# Patient Record
Sex: Male | Born: 1985 | Race: White | Hispanic: Yes | Marital: Single | Smoking: Current every day smoker
Health system: Southern US, Community
[De-identification: ages and names within clinical notes are randomized; demographics above are authoritative.]

---

## 2010-11-05 ENCOUNTER — Encounter: Payer: Self-pay | Admitting: Emergency Medicine

## 2010-11-05 ENCOUNTER — Emergency Department (INDEPENDENT_AMBULATORY_CARE_PROVIDER_SITE_OTHER): Payer: Self-pay

## 2010-11-05 ENCOUNTER — Emergency Department (HOSPITAL_BASED_OUTPATIENT_CLINIC_OR_DEPARTMENT_OTHER)
Admission: EM | Admit: 2010-11-05 | Discharge: 2010-11-05 | Disposition: A | Discharge status: Home / Self Care | Payer: Self-pay | Attending: Emergency Medicine | Admitting: Emergency Medicine

## 2010-11-05 DIAGNOSIS — Y92410 Unspecified street and highway as the place of occurrence of the external cause: Secondary | ICD-10-CM | POA: Insufficient documentation

## 2010-11-05 DIAGNOSIS — R079 Chest pain, unspecified: Secondary | ICD-10-CM

## 2010-11-05 DIAGNOSIS — M542 Cervicalgia: Secondary | ICD-10-CM

## 2010-11-05 DIAGNOSIS — M25519 Pain in unspecified shoulder: Secondary | ICD-10-CM

## 2010-11-05 LAB — ETHANOL: Alcohol, Ethyl (B): 144 mg/dL — ABNORMAL HIGH (ref 0–11)

## 2010-11-05 MED ORDER — OXYCODONE-ACETAMINOPHEN 5-325 MG PO TABS: 2.0000 | ORAL_TABLET | ORAL | Status: AC | PRN

## 2010-11-05 MED ORDER — SODIUM CHLORIDE 0.9 % IV SOLN
Freq: Once | INTRAVENOUS | Status: AC
  Administered 2010-11-05: 06:00:00 via INTRAVENOUS
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
## 2010-11-05 NOTE — ED Notes (Signed)
Assisted pt with ambulation in hallway without difficulty.  Pt is CAOx4.  C-Collar removed by Dr. Radford Pax.  He continues to report left shoulder pain that increases with movement.

## 2010-11-05 NOTE — ED Notes (Signed)
No change in pt assessment.  Pt continues to sleep and awakens to voice.
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
## 2013-03-28 ENCOUNTER — Emergency Department (HOSPITAL_BASED_OUTPATIENT_CLINIC_OR_DEPARTMENT_OTHER)
Admission: EM | Admit: 2013-03-28 | Discharge: 2013-03-28 | Disposition: A | Discharge status: Home / Self Care | Payer: Self-pay | Attending: Emergency Medicine | Admitting: Emergency Medicine

## 2013-03-28 ENCOUNTER — Emergency Department (HOSPITAL_BASED_OUTPATIENT_CLINIC_OR_DEPARTMENT_OTHER): Payer: Self-pay

## 2013-03-28 ENCOUNTER — Encounter (HOSPITAL_BASED_OUTPATIENT_CLINIC_OR_DEPARTMENT_OTHER): Payer: Self-pay | Admitting: Emergency Medicine

## 2013-03-28 DIAGNOSIS — F17200 Nicotine dependence, unspecified, uncomplicated: Secondary | ICD-10-CM | POA: Insufficient documentation

## 2013-03-28 DIAGNOSIS — B9789 Other viral agents as the cause of diseases classified elsewhere: Secondary | ICD-10-CM | POA: Insufficient documentation

## 2013-03-28 DIAGNOSIS — R0602 Shortness of breath: Secondary | ICD-10-CM | POA: Insufficient documentation

## 2013-03-28 DIAGNOSIS — B349 Viral infection, unspecified: Secondary | ICD-10-CM

## 2013-03-28 MED ORDER — BENZONATATE 100 MG PO CAPS: 100.0000 mg | ORAL_CAPSULE | Freq: Three times a day (TID) | ORAL | Status: DC

## 2013-03-28 MED ORDER — IBUPROFEN 800 MG PO TABS
800.0000 mg | ORAL_TABLET | Freq: Once | ORAL | Status: AC
  Administered 2013-03-28: 800 mg via ORAL
  Filled 2013-03-28: qty 1

## 2013-03-28 MED ORDER — ONDANSETRON 4 MG PO TBDP
4.0000 mg | ORAL_TABLET | Freq: Once | ORAL | Status: AC
  Administered 2013-03-28: 4 mg via ORAL
  Filled 2013-03-28: qty 1

## 2013-03-28 MED ORDER — IBUPROFEN 800 MG PO TABS: 800.0000 mg | ORAL_TABLET | Freq: Three times a day (TID) | ORAL | Status: DC

## 2013-03-28 NOTE — ED Provider Notes (Signed)
Medical screening examination/treatment/procedure(s) were performed by non-physician practitioner and as supervising physician I was immediately available for consultation/collaboration.     Geoffery Lyonsouglas Liany Mumpower, MD 03/28/13 [id number]

## 2013-03-28 NOTE — ED Provider Notes (Signed)
CSN: [id number]     Arrival date & time 03/28/13  1052 History   First MD Initiated Contact with Patient 03/28/13 1229     Chief Complaint  Patient presents with  . Influenza   (Consider location/radiation/quality/duration/timing/severity/associated sxs/prior Treatment) Patient is a 28 y.o. male presenting with cough. The history is provided by the patient. No language interpreter was used.  Cough Cough characteristics:  Productive Sputum characteristics:  Green Severity:  Moderate Onset quality:  Gradual Duration:  1 week Timing:  Constant Progression:  Worsening Relieved by:  Nothing Worsened by:  Nothing tried Ineffective treatments:  None tried Associated symptoms: fever, headaches, shortness of breath and sore throat     History reviewed. No pertinent past medical history. History reviewed. No pertinent past surgical history. No family history on file. History  Substance Use Topics  . Smoking status: Current Every Day Smoker -- 0.50 packs/day    Types: Cigarettes  . Smokeless tobacco: Not on file  . Alcohol Use: Yes    Review of Systems  Constitutional: Positive for fever.  HENT: Positive for sore throat.   Respiratory: Positive for cough and shortness of breath.   Neurological: Positive for headaches.  All other systems reviewed and are negative.    Allergies  Fish oil  Home Medications   Current Outpatient Rx  Name  Route  Sig  Dispense  Refill  . fentaNYL (FENTORA) 100 MCG buccal tablet   Intravenous   Inject 150 mcg into the vein once.            BP 120/72  Pulse 101  Temp(Src) 98.3 F (36.8 C) (Oral)  Resp 16  Ht 5\' 5"  (1.651 m)  Wt 155 lb (70.308 kg)  BMI 25.79 kg/m2  SpO2 100% Physical Exam  Nursing note and vitals reviewed. Constitutional: He appears well-developed and well-nourished.  HENT:  Head: Normocephalic.  Right Ear: External ear normal.  Left Ear: External ear normal.  Nose: Nose normal.  Mouth/Throat: Oropharynx is clear  and moist.  Eyes: Conjunctivae and EOM are normal. Pupils are equal, round, and reactive to light.  Neck: Normal range of motion. Neck supple.  Cardiovascular: Normal rate and normal heart sounds.   Pulmonary/Chest: Effort normal and breath sounds normal.  Abdominal: Soft.  Musculoskeletal: Normal range of motion.  Neurological: He is alert.  Skin: Skin is warm.    ED Course  Procedures (including critical care time) Labs Review Labs Reviewed - No data to display Imaging Review Dg Chest 2 View  03/28/2013   CLINICAL DATA:  Cough and fever.  EXAM: CHEST  2 VIEW  COMPARISON:  11/05/2010  FINDINGS: The heart size and mediastinal contours are within normal limits. Both lungs are clear. The visualized skeletal structures are unremarkable.  IMPRESSION: No active cardiopulmonary disease.   Electronically Signed   By: Richarda OverlieAdam  Henn M.D.   On: 03/28/2013 14:11    EKG Interpretation   None       MDM   1. Viral illness   rx for ibuprofen and tessalon.    Return if any problems    Elson AreasLeslie K Dwayne Bulkley, [place]PA-C 03/28/13 1443

## 2013-03-28 NOTE — ED Notes (Signed)
Cough, fever, nausea, no appetite, and body aches for a week.

## 2013-03-28 NOTE — Discharge Instructions (Signed)
Viral Infections °A virus is a type of germ. Viruses can cause: °· Minor sore throats. °· Aches and pains. °· Headaches. °· Runny nose. °· Rashes. °· Watery eyes. °· Tiredness. °· Coughs. °· Loss of appetite. °· Feeling sick to your stomach (nausea). °· Throwing up (vomiting). °· Watery poop (diarrhea). °HOME CARE  °· Only take medicines as told by your doctor. °· Drink enough water and fluids to keep your pee (urine) clear or pale yellow. Sports drinks are a good choice. °· Get plenty of rest and eat healthy. Soups and broths with crackers or rice are fine. °GET HELP RIGHT AWAY IF:  °· You have a very bad headache. °· You have shortness of breath. °· You have chest pain or neck pain. °· You have an unusual rash. °· You cannot stop throwing up. °· You have watery poop that does not stop. °· You cannot keep fluids down. °· You or your child has a temperature by mouth above 102° F (38.9° C), not controlled by medicine. °· Your baby is older than 3 months with a rectal temperature of 102° F (38.9° C) or higher. °· Your baby is 3 months old or younger with a rectal temperature of 100.4° F (38° C) or higher. °MAKE SURE YOU:  °· Understand these instructions. °· Will watch this condition. °· Will get help right away if you are not doing well or get worse. °Document Released: 02/25/2008 Document Revised: 06/06/2011 Document Reviewed: 07/20/2010 °ExitCare® Patient Information ©2014 [employer]. ° °

## 2013-06-12 ENCOUNTER — Emergency Department (HOSPITAL_BASED_OUTPATIENT_CLINIC_OR_DEPARTMENT_OTHER)
Admission: EM | Admit: 2013-06-12 | Discharge: 2013-06-12 | Disposition: A | Discharge status: Home / Self Care | Payer: Self-pay | Attending: Emergency Medicine | Admitting: Emergency Medicine

## 2013-06-12 ENCOUNTER — Encounter (HOSPITAL_BASED_OUTPATIENT_CLINIC_OR_DEPARTMENT_OTHER): Payer: Self-pay | Admitting: Emergency Medicine

## 2013-06-12 DIAGNOSIS — K598 Other specified functional intestinal disorders: Secondary | ICD-10-CM

## 2013-06-12 DIAGNOSIS — L738 Other specified follicular disorders: Secondary | ICD-10-CM | POA: Insufficient documentation

## 2013-06-12 DIAGNOSIS — L739 Follicular disorder, unspecified: Secondary | ICD-10-CM

## 2013-06-12 DIAGNOSIS — K6289 Other specified diseases of anus and rectum: Secondary | ICD-10-CM

## 2013-06-12 DIAGNOSIS — K5989 Other specified functional intestinal disorders: Secondary | ICD-10-CM | POA: Insufficient documentation

## 2013-06-12 DIAGNOSIS — F17200 Nicotine dependence, unspecified, uncomplicated: Secondary | ICD-10-CM | POA: Insufficient documentation

## 2013-06-12 LAB — URINALYSIS, ROUTINE W REFLEX MICROSCOPIC
Bilirubin Urine: NEGATIVE
GLUCOSE, UA: NEGATIVE mg/dL
HGB URINE DIPSTICK: NEGATIVE
KETONES UR: NEGATIVE mg/dL
LEUKOCYTES UA: NEGATIVE
Nitrite: NEGATIVE
PH: 8 (ref 5.0–8.0)
PROTEIN: NEGATIVE mg/dL
Specific Gravity, Urine: 1.009 (ref 1.005–1.030)
Urobilinogen, UA: 1 mg/dL (ref 0.0–1.0)

## 2013-06-12 LAB — HIV ANTIBODY (ROUTINE TESTING W REFLEX): HIV: NONREACTIVE

## 2013-06-12 MED ORDER — HYDROCORTISONE 2.5 % RE CREA: TOPICAL_CREAM | RECTAL | Status: AC

## 2013-06-12 MED ORDER — DOXYCYCLINE HYCLATE 100 MG PO CAPS: 100.0000 mg | ORAL_CAPSULE | Freq: Two times a day (BID) | ORAL | Status: AC

## 2013-06-12 NOTE — ED Provider Notes (Signed)
Medical screening examination/treatment/procedure(s) were performed by non-physician practitioner and as supervising physician I was immediately available for consultation/collaboration.   EKG Interpretation None       Courtney F Horton, MD 06/12/13 1524 

## 2013-06-12 NOTE — Discharge Instructions (Signed)
Folliculitis  Folliculitis is redness, soreness, and swelling (inflammation) of the hair follicles. This condition can occur anywhere on the body. People with weakened immune systems, diabetes, or obesity have a greater risk of getting folliculitis. CAUSES  Bacterial infection. This is the most common cause.  Fungal infection.  Viral infection.  Contact with certain chemicals, especially oils and tars. Long-term folliculitis can result from bacteria that live in the nostrils. The bacteria may trigger multiple outbreaks of folliculitis over time. SYMPTOMS Folliculitis most commonly occurs on the scalp, thighs, legs, back, buttocks, and areas where hair is shaved frequently. An early sign of folliculitis is a small, white or yellow, pus-filled, itchy lesion (pustule). These lesions appear on a red, inflamed follicle. They are usually less than 0.2 inches (5 mm) wide. When there is an infection of the follicle that goes deeper, it becomes a boil or furuncle. A group of closely packed boils creates a larger lesion (carbuncle). Carbuncles tend to occur in hairy, sweaty areas of the body. DIAGNOSIS  Your caregiver can usually tell what is wrong by doing a physical exam. A sample may be taken from one of the lesions and tested in a lab. This can help determine what is causing your folliculitis. TREATMENT  Treatment may include:  Applying warm compresses to the affected areas.  Taking antibiotic medicines orally or applying them to the skin.  Draining the lesions if they contain a large amount of pus or fluid.  Laser hair removal for cases of long-lasting folliculitis. This helps to prevent regrowth of the hair. HOME CARE INSTRUCTIONS  Apply warm compresses to the affected areas as directed by your caregiver.  If antibiotics are prescribed, take them as directed. Finish them even if you start to feel better.  You may take over-the-counter medicines to relieve itching.  Do not shave  irritated skin.  Follow up with your caregiver as directed. SEEK IMMEDIATE MEDICAL CARE IF:   You have increasing redness, swelling, or pain in the affected area.  You have a fever. MAKE SURE YOU:  Understand these instructions.  Will watch your condition.  Will get help right away if you are not doing well or get worse. Document Released: 05/23/2001 Document Revised: 09/13/2011 Document Reviewed: 06/14/2011 ExitCare Patient Information 2014 [employer].  

## 2013-06-12 NOTE — ED Provider Notes (Signed)
CSN: [id number]     Arrival date & time 06/12/13  1239 History   First MD Initiated Contact with Patient 06/12/13 1249     Chief Complaint  Patient presents with  . Dysuria     (Consider location/radiation/quality/duration/timing/severity/associated sxs/prior Treatment) Patient is a 28 y.o. male presenting with dysuria. The history is provided by the patient. No language interpreter was used.  Dysuria This is a new problem. The current episode started today. The problem occurs constantly. The problem has been unchanged. Nothing aggravates the symptoms. He has tried nothing for the symptoms. The treatment provided mild relief.    History reviewed. No pertinent past medical history. History reviewed. No pertinent past surgical history. History reviewed. No pertinent family history. History  Substance Use Topics  . Smoking status: Current Every Day Smoker -- 0.50 packs/day    Types: Cigarettes  . Smokeless tobacco: Not on file  . Alcohol Use: Yes    Review of Systems  Genitourinary: Positive for dysuria.  All other systems reviewed and are negative.      Allergies  Fish oil  Home Medications  No current outpatient prescriptions on file. BP 130/83  Pulse 85  Temp(Src) 97.3 F (36.3 C) (Oral)  Resp 18  Ht 5\' 5"  (1.651 m)  Wt 135 lb (61.236 kg)  BMI 22.47 kg/m2  SpO2 100% Physical Exam  Nursing note and vitals reviewed. Constitutional: He appears well-developed and well-nourished.  HENT:  Head: Normocephalic and atraumatic.  Eyes: Pupils are equal, round, and reactive to light.  Neck: Normal range of motion.  Cardiovascular: Normal rate.   Pulmonary/Chest: Effort normal.  Abdominal: Soft.  Genitourinary: Penis normal.  Small pimples shaft,  Looks like hair follicles,    Erythema tip of penis   Erythema rectum  Musculoskeletal: Normal range of motion.  Neurological: He is alert.  Skin: Skin is warm.  Psychiatric: He has a normal mood and affect.    ED Course   Procedures (including critical care time) Labs Review Labs Reviewed  URINALYSIS, ROUTINE W REFLEX MICROSCOPIC   Imaging Review No results found.   EKG Interpretation None      MDM   Final diagnoses:  Folliculitis  Rectal irritation    GC ct and rpr pending.    Pt given rx for doxycycline  And anusol foam.      Elson AreasLeslie K Indyah Saulnier, PA-C 06/12/13 1349

## 2013-06-12 NOTE — ED Notes (Signed)
Pt reports constipation and dysuria x 4 days.  Also reports (L) flank pain.

## 2013-06-13 LAB — GC/CHLAMYDIA PROBE AMP
CT Probe RNA: NEGATIVE
GC Probe RNA: NEGATIVE

## 2015-12-14 ENCOUNTER — Emergency Department (HOSPITAL_COMMUNITY)
Admission: EM | Admit: 2015-12-14 | Discharge: 2015-12-14 | Disposition: A | Discharge status: Home / Self Care | Payer: No Typology Code available for payment source | Attending: Emergency Medicine | Admitting: Emergency Medicine

## 2015-12-14 ENCOUNTER — Emergency Department (HOSPITAL_COMMUNITY): Payer: No Typology Code available for payment source

## 2015-12-14 ENCOUNTER — Encounter (HOSPITAL_COMMUNITY): Payer: Self-pay | Admitting: *Deleted

## 2015-12-14 DIAGNOSIS — Y999 Unspecified external cause status: Secondary | ICD-10-CM | POA: Diagnosis not present

## 2015-12-14 DIAGNOSIS — M542 Cervicalgia: Secondary | ICD-10-CM | POA: Insufficient documentation

## 2015-12-14 DIAGNOSIS — Y92410 Unspecified street and highway as the place of occurrence of the external cause: Secondary | ICD-10-CM | POA: Diagnosis not present

## 2015-12-14 DIAGNOSIS — M549 Dorsalgia, unspecified: Secondary | ICD-10-CM | POA: Insufficient documentation

## 2015-12-14 DIAGNOSIS — Y939 Activity, unspecified: Secondary | ICD-10-CM | POA: Insufficient documentation

## 2015-12-14 DIAGNOSIS — F17210 Nicotine dependence, cigarettes, uncomplicated: Secondary | ICD-10-CM | POA: Diagnosis not present

## 2015-12-14 MED ORDER — CYCLOBENZAPRINE HCL 10 MG PO TABS
5.0000 mg | ORAL_TABLET | Freq: Once | ORAL | Status: AC
  Administered 2015-12-14: 5 mg via ORAL
  Filled 2015-12-14: qty 1

## 2015-12-14 MED ORDER — NAPROXEN 500 MG PO TABS: 500.0000 mg | ORAL_TABLET | Freq: Two times a day (BID) | ORAL | 0 refills | Status: AC

## 2015-12-14 MED ORDER — IBUPROFEN 400 MG PO TABS
600.0000 mg | ORAL_TABLET | Freq: Once | ORAL | Status: AC
  Administered 2015-12-14: 600 mg via ORAL
  Filled 2015-12-14: qty 1

## 2015-12-14 MED ORDER — CYCLOBENZAPRINE HCL 5 MG PO TABS: 10.0000 mg | ORAL_TABLET | Freq: Three times a day (TID) | ORAL | 0 refills | Status: AC | PRN

## 2015-12-14 MED ORDER — HYDROCODONE-ACETAMINOPHEN 5-325 MG PO TABS
1.0000 | ORAL_TABLET | Freq: Once | ORAL | Status: AC
  Administered 2015-12-14: 1 via ORAL
  Filled 2015-12-14: qty 1

## 2015-12-14 NOTE — ED Notes (Signed)
No answer in waiting room or sub waiting room when patient called

## 2015-12-14 NOTE — ED Notes (Signed)
Pt found walking around pod D. Pt escorted pt to subway and instructed to wait here to be called for room. Fast track Staff made aware about pt.

## 2015-12-14 NOTE — ED Notes (Signed)
Bladder scanner after urination - 15ml

## 2015-12-14 NOTE — Discharge Instructions (Signed)
Read the information below.  Your x-rays were re-assuring. You were able to void in the ED. Your pain improved.  You are going to have some muscle soreness for the next 2-3 days. I have prescribed naprosyn and a muscle relaxer. Take as needed. The muscle relaxer can make you drowsy, do not drive after taking. You can apply heat or ice to affected areas. Warm showers may soothe aching muscles.  Use the prescribed medication as directed.  Please discuss all new medications with your pharmacist.   It is important to establish a primary provider, [facility]. If your symptoms persist more than 5 days, [facility] You may return to the Emergency Department at any time for worsening condition or any new symptoms that concern you. Return to ED if you develop loss of bowel or bladder function, numbness around your groin or bottom, unable to walk.

## 2015-12-14 NOTE — ED Notes (Signed)
Assisted PA in rectal exam

## 2015-12-14 NOTE — ED Notes (Signed)
Bladder Scan - 250 ml
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
## 2015-12-14 NOTE — ED Notes (Signed)
Pt responds appropriately to questions when aroused.
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
## 2015-12-15 ENCOUNTER — Telehealth: Payer: Self-pay | Admitting: *Deleted

## 2015-12-15 NOTE — Telephone Encounter (Signed)
Pharmacy called regarding ordering MD not registered with Medicaid.  EDCM advised to add Dr Denton LankSteinl.

## 2016-04-12 ENCOUNTER — Emergency Department (HOSPITAL_BASED_OUTPATIENT_CLINIC_OR_DEPARTMENT_OTHER)
Admission: EM | Admit: 2016-04-12 | Discharge: 2016-04-12 | Disposition: A | Discharge status: Home / Self Care | Payer: Medicaid Other | Attending: Emergency Medicine | Admitting: Emergency Medicine

## 2016-04-12 ENCOUNTER — Encounter (HOSPITAL_BASED_OUTPATIENT_CLINIC_OR_DEPARTMENT_OTHER): Payer: Self-pay | Admitting: *Deleted

## 2016-04-12 ENCOUNTER — Encounter (HOSPITAL_COMMUNITY): Payer: Self-pay

## 2016-04-12 ENCOUNTER — Emergency Department (HOSPITAL_BASED_OUTPATIENT_CLINIC_OR_DEPARTMENT_OTHER): Payer: Medicaid Other

## 2016-04-12 ENCOUNTER — Emergency Department (HOSPITAL_COMMUNITY)
Admission: EM | Admit: 2016-04-12 | Discharge: 2016-04-12 | Disposition: A | Discharge status: Home / Self Care | Payer: Medicaid Other | Attending: Emergency Medicine | Admitting: Emergency Medicine

## 2016-04-12 DIAGNOSIS — R112 Nausea with vomiting, unspecified: Secondary | ICD-10-CM | POA: Diagnosis present

## 2016-04-12 DIAGNOSIS — R1084 Generalized abdominal pain: Secondary | ICD-10-CM | POA: Insufficient documentation

## 2016-04-12 DIAGNOSIS — Z79899 Other long term (current) drug therapy: Secondary | ICD-10-CM | POA: Insufficient documentation

## 2016-04-12 DIAGNOSIS — F17210 Nicotine dependence, cigarettes, uncomplicated: Secondary | ICD-10-CM | POA: Insufficient documentation

## 2016-04-12 DIAGNOSIS — R197 Diarrhea, unspecified: Secondary | ICD-10-CM | POA: Insufficient documentation

## 2016-04-12 DIAGNOSIS — R109 Unspecified abdominal pain: Secondary | ICD-10-CM | POA: Insufficient documentation

## 2016-04-12 DIAGNOSIS — Z5321 Procedure and treatment not carried out due to patient leaving prior to being seen by health care provider: Secondary | ICD-10-CM | POA: Diagnosis not present

## 2016-04-12 LAB — URINALYSIS, ROUTINE W REFLEX MICROSCOPIC
GLUCOSE, UA: NEGATIVE mg/dL
HGB URINE DIPSTICK: NEGATIVE
Ketones, ur: 15 mg/dL — AB
Leukocytes, UA: NEGATIVE
Nitrite: NEGATIVE
PH: 6 (ref 5.0–8.0)
PROTEIN: NEGATIVE mg/dL
Specific Gravity, Urine: 1.03 (ref 1.005–1.030)

## 2016-04-12 LAB — CBC
HEMATOCRIT: 47.3 % (ref 39.0–52.0)
Hemoglobin: 16.3 g/dL (ref 13.0–17.0)
MCH: 29.5 pg (ref 26.0–34.0)
MCHC: 34.5 g/dL (ref 30.0–36.0)
MCV: 85.5 fL (ref 78.0–100.0)
PLATELETS: 229 10*3/uL (ref 150–400)
RBC: 5.53 MIL/uL (ref 4.22–5.81)
RDW: 13.1 % (ref 11.5–15.5)
WBC: 13.8 10*3/uL — ABNORMAL HIGH (ref 4.0–10.5)

## 2016-04-12 LAB — COMPREHENSIVE METABOLIC PANEL
ALT: 64 U/L — AB (ref 17–63)
AST: 45 U/L — AB (ref 15–41)
Albumin: 4.4 g/dL (ref 3.5–5.0)
Alkaline Phosphatase: 74 U/L (ref 38–126)
Anion gap: 12 (ref 5–15)
BUN: 15 mg/dL (ref 6–20)
CHLORIDE: 104 mmol/L (ref 101–111)
CO2: 21 mmol/L — AB (ref 22–32)
CREATININE: 0.86 mg/dL (ref 0.61–1.24)
Calcium: 9.4 mg/dL (ref 8.9–10.3)
GFR calc Af Amer: >60 mL/min (ref >60)
GFR calc non Af Amer: >60 mL/min (ref >60)
Glucose, Bld: 128 mg/dL — ABNORMAL HIGH (ref 65–99)
Potassium: 3.7 mmol/L (ref 3.5–5.1)
SODIUM: 137 mmol/L (ref 135–145)
Total Bilirubin: 1.2 mg/dL (ref 0.3–1.2)
Total Protein: 7.9 g/dL (ref 6.5–8.1)

## 2016-04-12 LAB — LIPASE, BLOOD: Lipase: 159 U/L — ABNORMAL HIGH (ref 11–51)

## 2016-04-12 MED ORDER — SODIUM CHLORIDE 0.9 % IV BOLUS (SEPSIS)
1000.0000 mL | Freq: Once | INTRAVENOUS | Status: AC
  Administered 2016-04-12: 1000 mL via INTRAVENOUS

## 2016-04-12 MED ORDER — IOPAMIDOL (ISOVUE-300) INJECTION 61%
100.0000 mL | Freq: Once | INTRAVENOUS | Status: AC | PRN
  Administered 2016-04-12: 100 mL via INTRAVENOUS

## 2016-04-12 MED ORDER — ONDANSETRON HCL 4 MG/2ML IJ SOLN
4.0000 mg | Freq: Once | INTRAMUSCULAR | Status: AC
  Administered 2016-04-12: 4 mg via INTRAVENOUS
  Filled 2016-04-12: qty 2

## 2016-04-12 MED ORDER — METOCLOPRAMIDE HCL 5 MG/ML IJ SOLN
10.0000 mg | Freq: Once | INTRAMUSCULAR | Status: AC
  Administered 2016-04-12: 10 mg via INTRAVENOUS
  Filled 2016-04-12: qty 2

## 2016-04-12 MED ORDER — ONDANSETRON HCL 4 MG/2ML IJ SOLN: 4.0000 mg | Freq: Once | INTRAMUSCULAR | Status: DC

## 2016-04-12 MED ORDER — FENTANYL CITRATE (PF) 100 MCG/2ML IJ SOLN
50.0000 ug | INTRAMUSCULAR | Status: DC | PRN
  Administered 2016-04-12: 50 ug via INTRAVENOUS
  Filled 2016-04-12: qty 2

## 2016-04-12 MED ORDER — ONDANSETRON 4 MG PO TBDP: 4.0000 mg | ORAL_TABLET | Freq: Three times a day (TID) | ORAL | 0 refills | Status: AC | PRN

## 2016-04-12 MED ORDER — ONDANSETRON 4 MG PO TBDP
ORAL_TABLET | ORAL | Status: AC
  Filled 2016-04-12: qty 1

## 2016-04-12 MED ORDER — ONDANSETRON 4 MG PO TBDP
4.0000 mg | ORAL_TABLET | Freq: Once | ORAL | Status: AC | PRN
  Administered 2016-04-12: 4 mg via ORAL

## 2016-04-12 NOTE — ED Notes (Addendum)
Called pt twice to reassess vitals. No answer. Will try again. Informed Jamie - RN.

## 2016-04-12 NOTE — ED Notes (Signed)
Patient transported to CT 

## 2016-04-12 NOTE — ED Notes (Signed)
Pt verbalizes understanding of d/c instructions and denies any further needs at this time. 

## 2016-04-12 NOTE — ED Provider Notes (Signed)
[facility]   CSN: [id number] Arrival date & time: 04/12/16  1714   By signing my name below, I, Nelwyn Salisbury, attest that this documentation has been prepared under the direction and in the presence of Nira Conn, MD . Electronically Signed: Nelwyn Salisbury, Scribe. 04/12/2016. 7:47 PM.  History   Chief Complaint Chief Complaint  Patient presents with  . Abdominal Pain  . Emesis  . Diarrhea   HPI  HPI Comments:  Mark Mason is an otherwise healthy 31 y.o. male who presents to the Emergency Department complaining of sudden-onset, frequent vomiting beginning yesterday at 5pm. Pt notes that he ate some shrimp for lunch yesterday that didn't taste right, which he believes may have caused his symptoms. He describes his pain as achy and exacerbated by palpation. Pt reports associated diarrhea, bloody stool, and epigastric abdominal tenderness that radiates to his chest. He has not tried anything at home for his symptoms.   History reviewed. No pertinent past medical history.  There are no active problems to display for this patient.   History reviewed. No pertinent surgical history.     Home Medications    Prior to Admission medications   Medication Sig Start Date End Date Taking? Authorizing Provider  cyclobenzaprine (FLEXERIL) 5 MG tablet Take 2 tablets (10 mg total) by mouth 3 (three) times daily as needed for muscle spasms. 12/14/15   Lona Kettle, PA-C  doxycycline (VIBRAMYCIN) 100 MG capsule Take 1 capsule (100 mg total) by mouth 2 (two) times daily. 06/12/13   Elson Areas, PA-C  hydrocortisone (ANUSOL-HC) 2.5 % rectal cream Apply rectally 2 times daily 06/12/13   Elson Areas, PA-C  naproxen (NAPROSYN) 500 MG tablet Take 1 tablet (500 mg total) by mouth 2 (two) times daily. 12/14/15   Lona Kettle, PA-C  ondansetron (ZOFRAN ODT) 4 MG disintegrating tablet Take 1 tablet (4 mg total) by mouth every 8 (eight) hours as needed  for nausea or vomiting. 04/12/16 04/15/16  Nira Conn, MD    Family History No family history on file.  Social History Social History  Substance Use Topics  . Smoking status: Current Every Day Smoker    Packs/day: 0.50    Types: Cigarettes  . Smokeless tobacco: Never Used  . Alcohol use Yes     Allergies   Fish oil   Review of Systems Review of Systems 10 Systems reviewed and are negative for acute change except as noted in the HPI.   Physical Exam Updated Vital Signs BP 107/61 (BP Location: Right Arm)   Pulse 106   Temp 99.1 F (37.3 C) (Oral)   Resp 18   Ht 5\' 5"  (1.651 m)   Wt 175 lb (79.4 kg)   SpO2 100%   BMI 29.12 kg/m   Physical Exam  Constitutional: He is oriented to person, place, and time. He appears well-developed and well-nourished.  Non-toxic appearance. He does not have a sickly appearance. He appears ill. No distress.  HENT:  Head: Normocephalic and atraumatic.  Nose: Nose normal.  Dry mucous membranes  Eyes: Conjunctivae and EOM are normal. Pupils are equal, round, and reactive to light. Right eye exhibits no discharge. Left eye exhibits no discharge. No scleral icterus.  Neck: Normal range of motion. Neck supple.  Cardiovascular: Normal rate and regular rhythm.  Exam reveals no gallop and no friction rub.   No murmur heard. Pulmonary/Chest: Effort normal and breath sounds normal. No stridor. No respiratory distress. He  has no rales.  Abdominal: Soft. He exhibits no distension. There is tenderness. There is no rigidity and no rebound.  Diffuse abdominal tenderness  Musculoskeletal: He exhibits no edema or tenderness.  Neurological: He is alert and oriented to person, place, and time.  Skin: Skin is warm and dry. No rash noted. He is not diaphoretic. No erythema.  Psychiatric: He has a normal mood and affect.  Vitals reviewed.    ED Treatments / Results  DIAGNOSTIC STUDIES:  Oxygen Saturation is 99% on RA, normal by my  interpretation.    COORDINATION OF CARE:  9:00 PM Discussed treatment plan with pt at bedside which includes imaging and pt agreed to plan.  Labs (all labs ordered are listed, but only abnormal results are displayed) Labs Reviewed  URINALYSIS, ROUTINE W REFLEX MICROSCOPIC - Abnormal; Notable for the following:       Result Value   Color, Urine AMBER (*)    APPearance CLOUDY (*)    Bilirubin Urine SMALL (*)    Ketones, ur 15 (*)    All other components within normal limits    EKG  EKG Interpretation None       Radiology Ct Abdomen Pelvis W Contrast  Result Date: 04/12/2016 CLINICAL DATA:  Epigastric abdomen pain bloody stool vomiting and diarrhea EXAM: CT ABDOMEN AND PELVIS WITH CONTRAST TECHNIQUE: Multidetector CT imaging of the abdomen and pelvis was performed using the standard protocol following bolus administration of intravenous contrast. CONTRAST:  100mL ISOVUE-300 IOPAMIDOL (ISOVUE-300) INJECTION 61% COMPARISON:  None. FINDINGS: Lower chest: Lung bases demonstrate no acute consolidation or effusion. Normal heart size liver enlarged at 19 cm. Hepatobiliary: Diffuse decreased density consistent with steatosis. No focal abnormality. No calcified gallstones. No biliary dilatation. Probable focal fatty sparing near the gallbladder fossa. Pancreas: Unremarkable. No pancreatic ductal dilatation or surrounding inflammatory changes. Spleen: Normal in size without focal abnormality. Adrenals/Urinary Tract: Adrenal glands are unremarkable. Kidneys are normal, without renal calculi, focal lesion, or hydronephrosis. Bladder is unremarkable. Stomach/Bowel: Stomach is within normal limits. Appendix appears normal. No evidence of bowel wall thickening, distention, or inflammatory changes. Vascular/Lymphatic: Scattered subcentimeter mesenteric lymph nodes. No significant vascular abnormality Reproductive: Prostate is unremarkable. Other: Small fat in the inguinal canals.  No free air or free fluid  Musculoskeletal: No acute osseous abnormality. Large midline cleft through the S1 vertebral body likely represents congenital anomaly. IMPRESSION: 1. No CT evidence for acute intra-abdominal or pelvic pathology 2. 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   On: 04/12/2016 22:35    Procedures Procedures (including critical care time)  Medications Ordered in ED Medications  fentaNYL (SUBLIMAZE) injection 50 mcg (50 mcg Intravenous Given 04/12/16 2048)  sodium chloride 0.9 % bolus 1,000 mL (0 mLs Intravenous Stopped 04/12/16 2040)  ondansetron (ZOFRAN) injection 4 mg (4 mg Intravenous Given 04/12/16 2045)  sodium chloride 0.9 % bolus 1,000 mL (0 mLs Intravenous Stopped 04/12/16 2206)  metoCLOPramide (REGLAN) injection 10 mg (10 mg Intravenous Given 04/12/16 2108)  iopamidol (ISOVUE-300) 61 % injection 100 mL (100 mLs Intravenous Contrast Given 04/12/16 2216)     Initial Impression / Assessment and Plan / ED Course  I have reviewed the triage vital signs and the nursing notes.  Pertinent labs & imaging results that were available during my care of the patient were reviewed by me and considered in my medical decision making (see chart for details).  Clinical Course as of Apr 13 26  Patient was seen at [facility] where he obtained labs which revealed leukocytosis of 13.8; reassurance CBC without evidence of significant electrolyte derangements or renal sufficiency; there is mildly elevated transaminitis; mildly elevated lipase.  Presentation consistent with likely food poisoning however given his significant abdominal tenderness CT scan was obtained which revealed no evidence of serious acute inflammatory or infectious process.  Patient was given IV fluids, nausea medicine, pain medicine. He was able to tolerate by mouth fluids.  The patient is safe for discharge with strict return precautions.   Final Clinical  Impressions(s) / ED Diagnoses   Final diagnoses:  Nausea vomiting and diarrhea   Disposition: Discharge  Condition: Good  I have discussed the results, Dx and Tx plan with the patient who expressed understanding and agree(s) with the plan. Discharge instructions discussed at great length. The patient was given strict return precautions who verbalized understanding of the instructions. No further questions at time of discharge.    Discharge Medication List as of 04/12/2016 10:55 PM    START taking these medications   Details  ondansetron (ZOFRAN ODT) 4 MG disintegrating tablet Take 1 tablet (4 mg total) by mouth every 8 (eight) hours as needed for nausea or vomiting., Starting Tue 04/12/2016, Until Fri 04/15/2016, Print        Follow Up: primary care provider  Schedule an appointment as soon as possible for a visit  in 3-5 days, If symptoms do not improve or  worsen   I personally performed the services described in this documentation, which was scribed in my presence. The recorded information has been reviewed and is accurate.        Nira Conn, MD 04/13/16 0030

## 2016-04-12 NOTE — ED Triage Notes (Signed)
patient arrived hyperventilating to ED. Complains of abdominal cramping with vomiting and diarrhea since last night. Coached on slowing RR down. Alert and oriented, no vomiting on arival

## 2016-04-12 NOTE — ED Notes (Signed)
[facility]. He left prior to being seen.

## 2016-04-12 NOTE — ED Triage Notes (Signed)
Abdominal pain, vomiting and diarrhea since eating shrimp last night that did not taste right.

## 2016-04-12 NOTE — ED Notes (Signed)
No answer x 3 in lobby. 

## 2018-02-19 IMAGING — CT CT ABD-PELV W/ CM
2 of 4 series · 16 of 46 positions shown, 18 images · IV contrast (APPLIED)
Comparison: None.

CLINICAL DATA: Epigastric abdomen pain bloody stool vomiting and
diarrhea

EXAM:
CT ABDOMEN AND PELVIS WITH CONTRAST
TECHNIQUE: Multidetector CT imaging of the abdomen and pelvis was performed
using the standard protocol following bolus administration of
intravenous contrast.
CONTRAST:  100mL 5WH2ZG-RTT IOPAMIDOL (5WH2ZG-RTT) INJECTION 61%

[Series 2: axial st · axial · 0.87mm/px · z∈[-426,-1]mm · 13 of 93 slices shown, 15 images]
[im 4/93  soft-tissue]
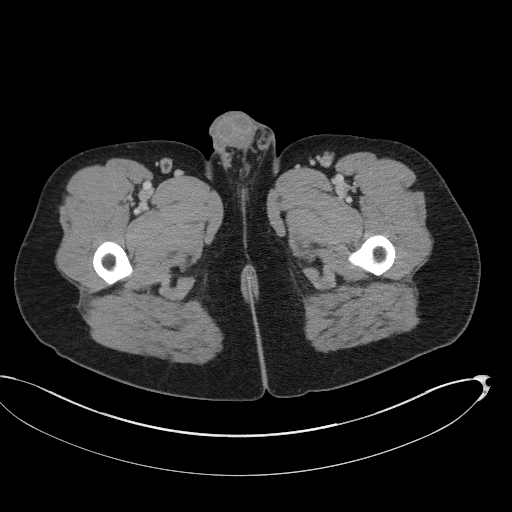
[im 4/93  bone]
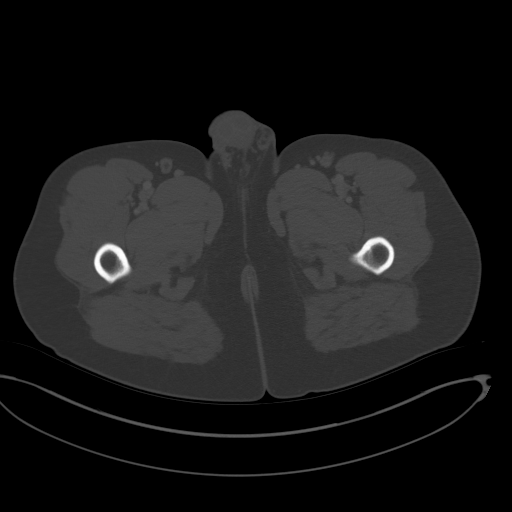
[im 12/93  soft-tissue]
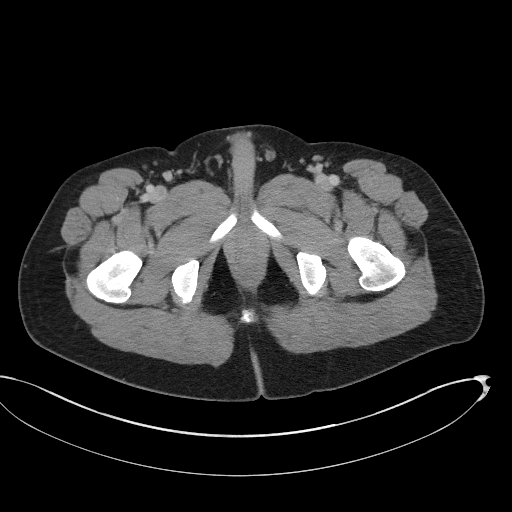
[im 20/93  soft-tissue]
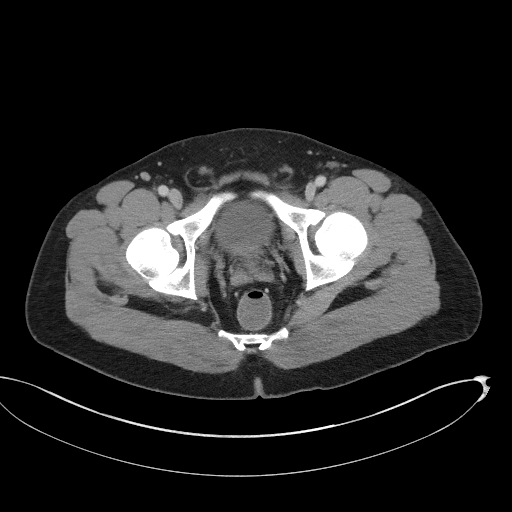
[im 27/93  soft-tissue]
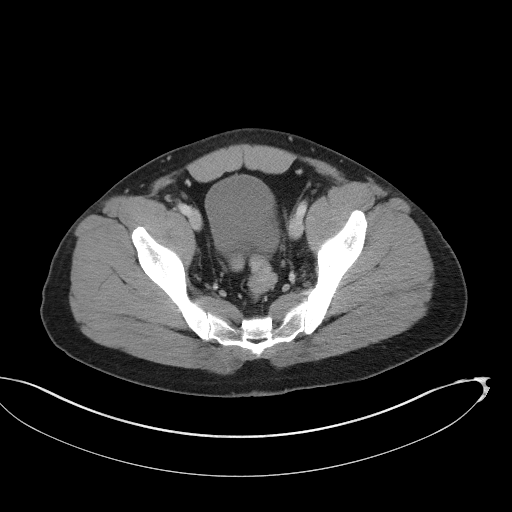
[im 31/93  soft-tissue]
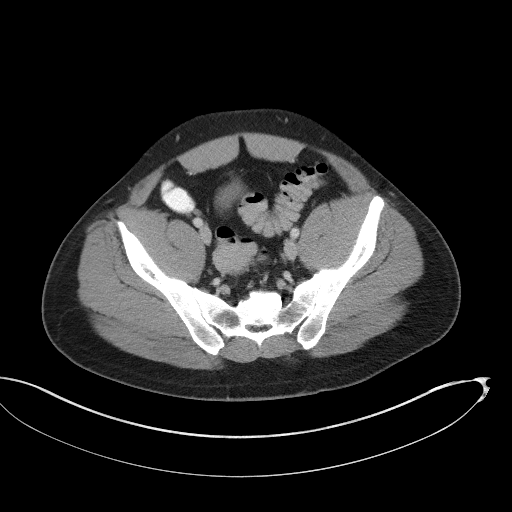
[im 39/93  soft-tissue]
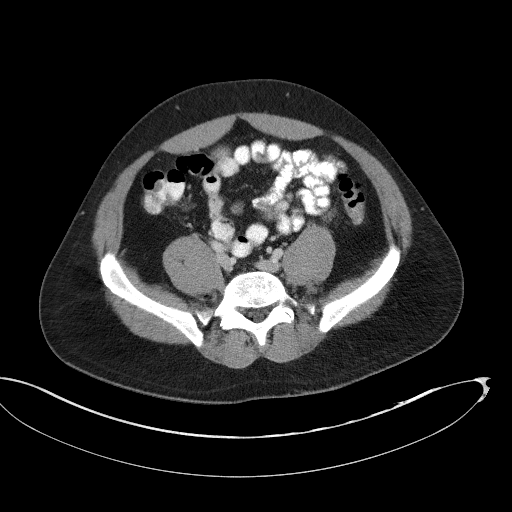
[im 47/93  soft-tissue]
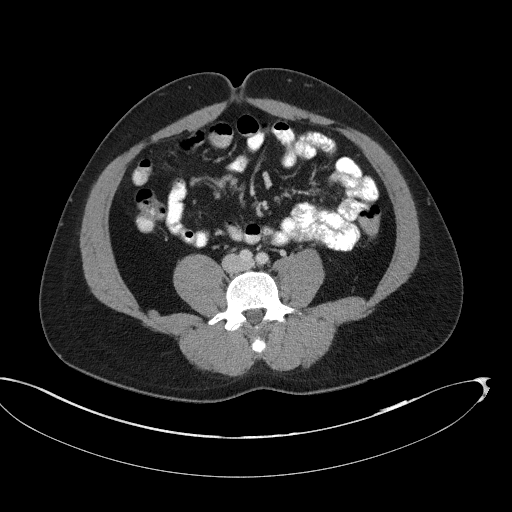
[im 54/93  soft-tissue]
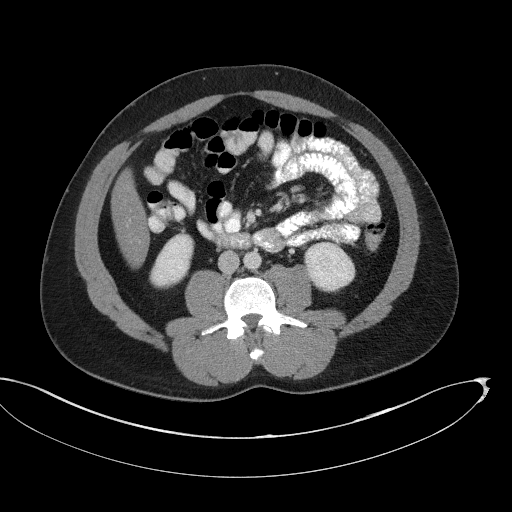
[im 62/93  soft-tissue]
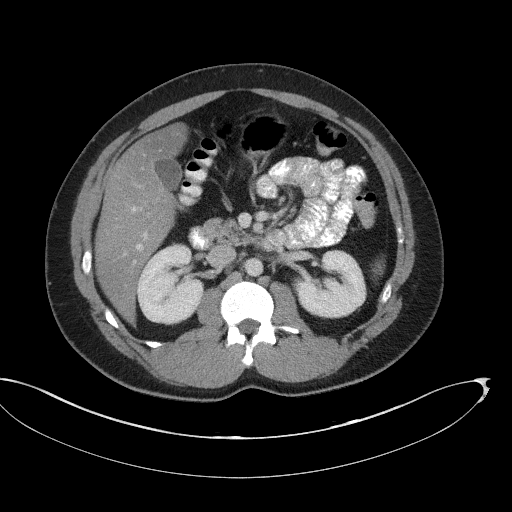
[im 62/93  bone]
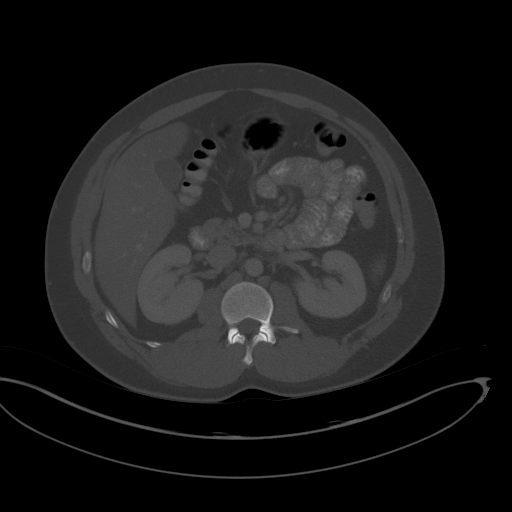
[im 66/93  soft-tissue]
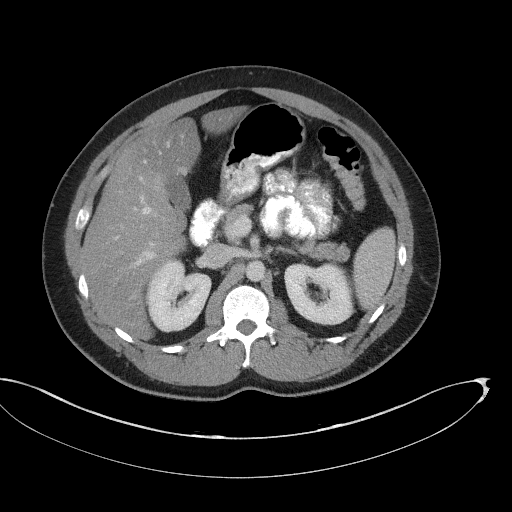
[im 73/93  soft-tissue]
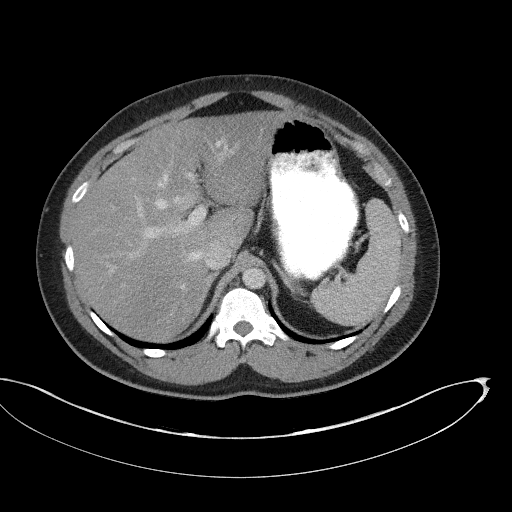
[im 81/93  soft-tissue]
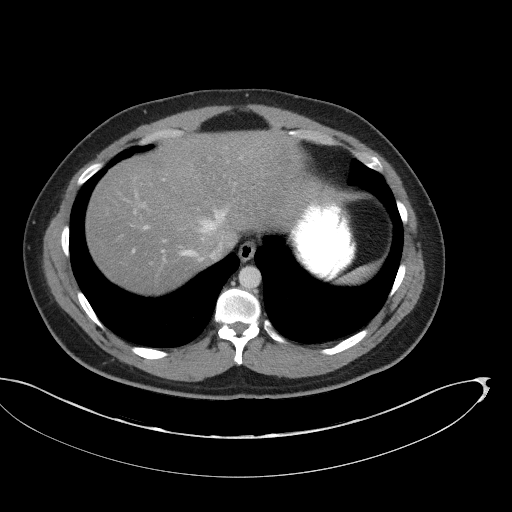
[im 89/93  soft-tissue]
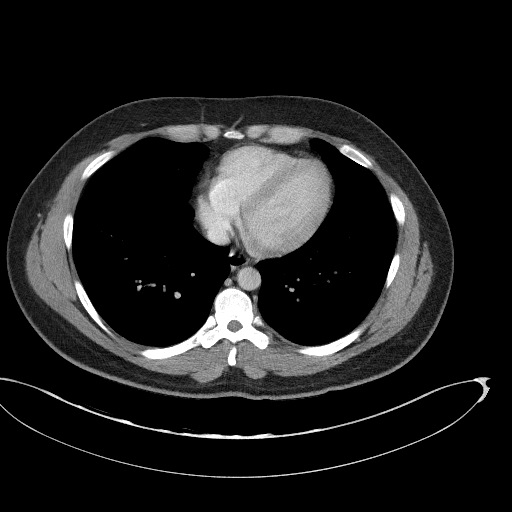

[Series 5: coronal st · coronal · 0.81mm/px · 3 of 97 slices shown]
[im 33/97  soft-tissue]
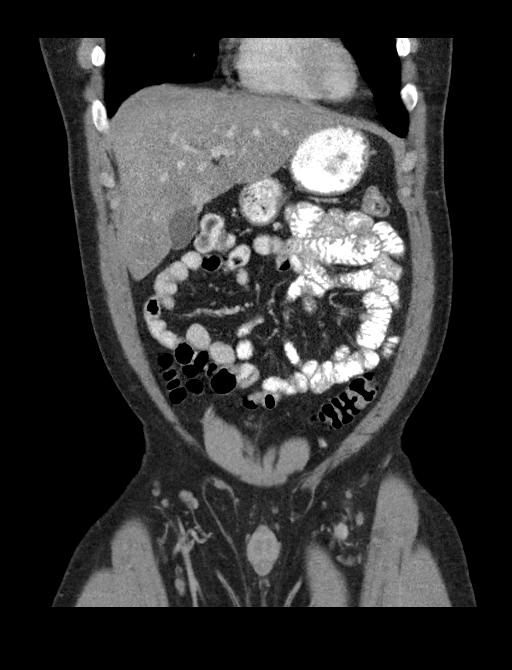
[im 43/97  soft-tissue]
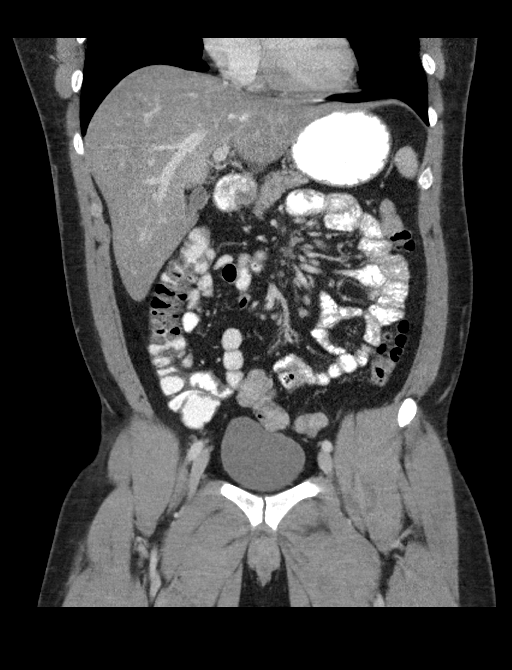
[im 54/97  soft-tissue]
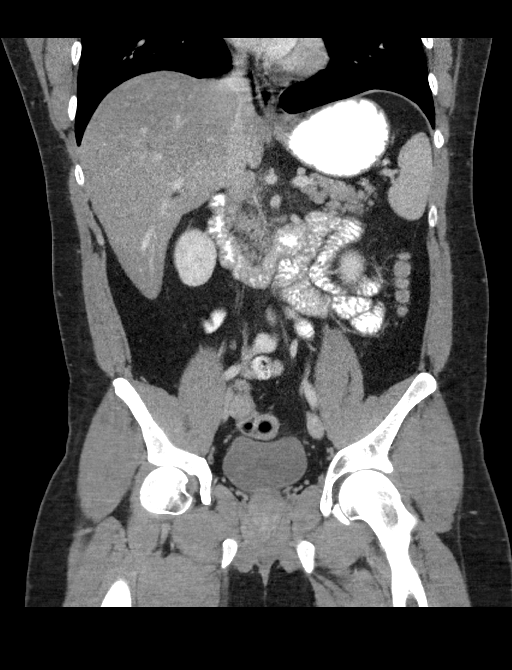

[16 of 46 positions shown; findings below may reference images not displayed]

FINDINGS: Lower chest: Lung bases demonstrate no acute consolidation or
effusion. Normal heart size liver enlarged at 19 cm.

Hepatobiliary: Diffuse decreased density consistent with steatosis.
No focal abnormality. No calcified gallstones. No biliary
dilatation. Probable focal fatty sparing near the gallbladder fossa.

Pancreas: Unremarkable. No pancreatic ductal dilatation or
surrounding inflammatory changes.

Spleen: Normal in size without focal abnormality.

Adrenals/Urinary Tract: Adrenal glands are unremarkable. Kidneys are
normal, without renal calculi, focal lesion, or hydronephrosis.
Bladder is unremarkable.

Stomach/Bowel: Stomach is within normal limits. Appendix appears
normal. No evidence of bowel wall thickening, distention, or
inflammatory changes.

Vascular/Lymphatic: Scattered subcentimeter mesenteric lymph nodes.
No significant vascular abnormality

Reproductive: Prostate is unremarkable.

Other: Small fat in the inguinal canals.  No free air or free fluid

Musculoskeletal: No acute osseous abnormality. Large midline cleft
through the S1 vertebral body likely represents congenital anomaly.
IMPRESSION: 1. No CT evidence for acute intra-abdominal or pelvic pathology
2. Enlarged fatty liver
3. Probable congenital anomaly of S1 vertebra

## 2019-11-04 ENCOUNTER — Other Ambulatory Visit: Payer: Self-pay

## 2019-11-04 ENCOUNTER — Emergency Department (HOSPITAL_BASED_OUTPATIENT_CLINIC_OR_DEPARTMENT_OTHER)
Admission: EM | Admit: 2019-11-04 | Discharge: 2019-11-04 | Discharge status: Absent without leave | Payer: Medicaid Other

## 2019-12-11 ENCOUNTER — Encounter (HOSPITAL_BASED_OUTPATIENT_CLINIC_OR_DEPARTMENT_OTHER): Payer: Self-pay | Admitting: Emergency Medicine

## 2019-12-11 ENCOUNTER — Other Ambulatory Visit: Payer: Self-pay

## 2019-12-11 ENCOUNTER — Emergency Department (HOSPITAL_BASED_OUTPATIENT_CLINIC_OR_DEPARTMENT_OTHER): Payer: Medicaid Other

## 2019-12-11 ENCOUNTER — Emergency Department (HOSPITAL_BASED_OUTPATIENT_CLINIC_OR_DEPARTMENT_OTHER)
Admission: EM | Admit: 2019-12-11 | Discharge: 2019-12-12 | Disposition: A | Discharge status: Home / Self Care | Payer: Medicaid Other | Attending: Emergency Medicine | Admitting: Emergency Medicine

## 2019-12-11 DIAGNOSIS — R509 Fever, unspecified: Secondary | ICD-10-CM | POA: Diagnosis present

## 2019-12-11 DIAGNOSIS — F17210 Nicotine dependence, cigarettes, uncomplicated: Secondary | ICD-10-CM | POA: Diagnosis not present

## 2019-12-11 DIAGNOSIS — U071 COVID-19: Secondary | ICD-10-CM

## 2019-12-11 DIAGNOSIS — Z79899 Other long term (current) drug therapy: Secondary | ICD-10-CM | POA: Diagnosis not present

## 2019-12-11 LAB — SARS CORONAVIRUS 2 BY RT PCR (HOSPITAL ORDER, PERFORMED IN ~~LOC~~ HOSPITAL LAB): SARS Coronavirus 2: POSITIVE — AB

## 2019-12-11 MED ORDER — ACETAMINOPHEN 500 MG PO TABS
1000.0000 mg | ORAL_TABLET | Freq: Once | ORAL | Status: AC
  Administered 2019-12-11: 21:00:00 1000 mg via ORAL
  Filled 2019-12-11: qty 2

## 2019-12-11 NOTE — Discharge Instructions (Addendum)
It was our pleasure to provide your ER care today - we hope that you feel better.  Your covid test is positive.  You may qualify for possible monoclonal antibody infusion, which helps decrease the chance of your symptoms worsening and/or you needing admission for covid.  We left message at the infusion center, and they should be contacting you tomorrow.   See covid information and instructions.  Return to ER if worse, new symptoms, increased trouble breathing, or other concern.

## 2019-12-11 NOTE — ED Triage Notes (Signed)
Generalized body aches, sore throat, cough x 3 days. First pfizer vaccination dose

## 2019-12-11 NOTE — ED Provider Notes (Signed)
[facility] EMERGENCY DEPARTMENT Provider Note   CSN: [id number] Arrival date & time: 12/11/19  1835     History Chief Complaint  Patient presents with  . Generalized Body Aches  . Cough  . Sore Throat    Mark Mason is a 34 y.o. male.  Patient c/o subjective fever, body aches, non prod cough, for the past 3 days. Symptoms acute onset, moderate, constant, felt worse today. Recent ill contacts with covid + friend. Did have one vaccination for covid, not the 2nd. Denies hx asthma or chronic disease. +smoker. No chest pain. No abd pain. No nvd. No dysuria or gu c/o. No rash.   The history is provided by the patient.  Cough Associated symptoms: fever, myalgias and sore throat   Associated symptoms: no chest pain, no headaches, no rash and no shortness of breath   Sore Throat Pertinent negatives include no chest pain, no abdominal pain, no headaches and no shortness of breath.       History reviewed. No pertinent past medical history.  There are no problems to display for this patient.   History reviewed. No pertinent surgical history.     History reviewed. No pertinent family history.  Social History   Tobacco Use  . Smoking status: Current Every Day Smoker    Packs/day: 0.50    Types: Cigarettes  . Smokeless tobacco: Never Used  Substance Use Topics  . Alcohol use: Yes  . Drug use: No    Home Medications Prior to Admission medications   Medication Sig Start Date End Date Taking? Authorizing Provider  cyclobenzaprine (FLEXERIL) 5 MG tablet Take 2 tablets (10 mg total) by mouth 3 (three) times daily as needed for muscle spasms. 12/14/15   Deborha Payment, PA-C  doxycycline (VIBRAMYCIN) 100 MG capsule Take 1 capsule (100 mg total) by mouth 2 (two) times daily. 06/12/13   Elson Areas, PA-C  hydrocortisone (ANUSOL-HC) 2.5 % rectal cream Apply rectally 2 times daily 06/12/13   Cheron Schaumann K, PA-C  naproxen (NAPROSYN) 500 MG tablet Take 1 tablet (500 mg  total) by mouth 2 (two) times daily. 12/14/15   Deborha Payment, PA-C    Allergies    Fish oil  Review of Systems   Review of Systems  Constitutional: Positive for fever.  HENT: Positive for sore throat.   Eyes: Negative for redness.  Respiratory: Positive for cough. Negative for shortness of breath.   Cardiovascular: Negative for chest pain and leg swelling.  Gastrointestinal: Negative for abdominal pain, diarrhea and vomiting.  Endocrine: Negative for polyuria.  Genitourinary: Negative for dysuria and flank pain.  Musculoskeletal: Positive for myalgias. Negative for back pain and neck pain.  Skin: Negative for rash.  Neurological: Negative for headaches.  Hematological: Does not bruise/bleed easily.  Psychiatric/Behavioral: Negative for confusion.    Physical Exam Updated Vital Signs BP (!) 136/102   Pulse (!) 123   Temp 99.1 F (37.3 C)   Resp 18   SpO2 100%   Physical Exam Vitals and nursing note reviewed.  Constitutional:      Appearance: Normal appearance. He is well-developed.  HENT:     Head: Atraumatic.     Nose: Nose normal.     Mouth/Throat:     Mouth: Mucous membranes are moist.     Pharynx: No oropharyngeal exudate or posterior oropharyngeal erythema.  Eyes:     General: No scleral icterus.    Conjunctiva/sclera: Conjunctivae normal.  Neck:     Trachea: No  tracheal deviation.     Comments: Trachea midline.  Cardiovascular:     Rate and Rhythm: Regular rhythm. Tachycardia present.     Pulses: Normal pulses.     Heart sounds: Normal heart sounds. No murmur heard.  No friction rub. No gallop.   Pulmonary:     Effort: Pulmonary effort is normal. No accessory muscle usage or respiratory distress.     Breath sounds: Normal breath sounds.  Abdominal:     General: Bowel sounds are normal. There is no distension.     Palpations: Abdomen is soft.     Tenderness: There is no abdominal tenderness. There is no guarding.  Genitourinary:    Comments: No cva  tenderness. Musculoskeletal:        General: No swelling or tenderness.     Cervical back: Normal range of motion and neck supple. No rigidity or tenderness.     Right lower leg: No edema.     Left lower leg: No edema.  Lymphadenopathy:     Cervical: No cervical adenopathy.  Skin:    General: Skin is warm and dry.     Findings: No rash.  Neurological:     Mental Status: He is alert.     Comments: Alert, speech clear. Steady gait.   Psychiatric:        Mood and Affect: Mood normal.     ED Results / Procedures / Treatments   Labs (all labs ordered are listed, but only abnormal results are displayed) Results for orders placed or performed during the hospital encounter of 12/11/19  SARS Coronavirus 2 by RT PCR (hospital order, performed in [facility] hospital lab) Nasopharyngeal Nasopharyngeal Swab   Specimen: Nasopharyngeal Swab  Result Value Ref Range   SARS Coronavirus 2 POSITIVE (A) NEGATIVE   DG Chest Port 1 View  Result Date: 12/11/2019 CLINICAL DATA:  Cough, generalized body aches, sore throat X 3 days. Smoker EXAM: PORTABLE CHEST 1 VIEW COMPARISON:  Chest x-ray 03/28/2013 FINDINGS: The heart size and mediastinal contours are within normal limits. No focal consolidation. No pulmonary edema. No pleural effusion. No pneumothorax. No acute osseous abnormality. IMPRESSION: No active disease. Electronically Signed   By: Tish Frederickson M.D.   On: 12/11/2019 21:17    EKG None  Radiology DG Chest Port 1 View  Result Date: 12/11/2019 CLINICAL DATA:  Cough, generalized body aches, sore throat X 3 days. Smoker EXAM: PORTABLE CHEST 1 VIEW COMPARISON:  Chest x-ray 03/28/2013 FINDINGS: The heart size and mediastinal contours are within normal limits. No focal consolidation. No pulmonary edema. No pleural effusion. No pneumothorax. No acute osseous abnormality. IMPRESSION: No active disease. Electronically Signed   By: Tish Frederickson M.D.   On: 12/11/2019 21:17     Procedures Procedures (including critical care time)  Medications Ordered in ED Medications  acetaminophen (TYLENOL) tablet 1,000 mg (has no administration in time range)    ED Course  I have reviewed the triage vital signs and the nursing notes.  Pertinent labs & imaging results that were available during my care of the patient were reviewed by me and considered in my medical decision making (see chart for details).    MDM Rules/Calculators/A&P                          Labs sent.   No meds pta or for fever today. Acetaminophen po, po fluids.   Reviewed nursing notes and prior charts for additional history.  Airborne and contact precautions.  Covid test pending.  Mark Mason was evaluated in Emergency Department on 12/11/2019 for the symptoms described in the history of present illness. He was evaluated in the context of the global COVID-19 pandemic, which necessitated consideration that the patient might be at risk for infection with the SARS-CoV-2 virus that causes COVID-19. Institutional protocols and algorithms that pertain to the evaluation of patients at risk for COVID-19 are in a state of rapid change based on information released by regulatory bodies including the CDC and federal and state organizations. These policies and algorithms were followed during the patient's care in the ED.  Initial labs reviewed/interpreted by me - covid is positive.   CXR reviewed/interpreted by me - no pna.   Recheck pt, hr 94, rr 16, pulse ox 99% room air. Pt breathing comfortably.   BMI is 29 - made referral to [facility] infusion center via secure chat, pt informed.   Pt currently appears stable for d/c.   Return precautions provided.      Final Clinical Impression(s) / ED Diagnoses Final diagnoses:  None    Rx / DC Orders ED Discharge Orders    None       Cathren Laine, MD 12/11/19 2316

## 2019-12-12 ENCOUNTER — Other Ambulatory Visit (HOSPITAL_COMMUNITY): Payer: Self-pay | Admitting: Nurse Practitioner

## 2019-12-12 DIAGNOSIS — U071 COVID-19: Secondary | ICD-10-CM

## 2019-12-12 MED ORDER — ALBUTEROL SULFATE HFA 108 (90 BASE) MCG/ACT IN AERS: 2.0000 | INHALATION_SPRAY | Freq: Four times a day (QID) | RESPIRATORY_TRACT | 0 refills | Status: AC | PRN

## 2019-12-12 MED ORDER — BENZONATATE 200 MG PO CAPS: 200.0000 mg | ORAL_CAPSULE | Freq: Three times a day (TID) | ORAL | 0 refills | Status: AC | PRN

## 2019-12-12 NOTE — Progress Notes (Signed)
I connected by phone with Mark Mason on 12/12/2019 at 11:37 AM to discuss the potential use of an new treatment for mild to moderate COVID-19 viral infection in non-hospitalized patients.  This patient is a 34 y.o. male that meets the FDA criteria for Emergency Use Authorization of casirivimab\imdevimab.  Has a (+) direct SARS-CoV-2 viral test result  Has mild or moderate COVID-19   Is ? 34 years of age and weighs ? 40 kg  Is NOT hospitalized due to COVID-19  Is NOT requiring oxygen therapy or requiring an increase in baseline oxygen flow rate due to COVID-19  Is within 10 days of symptom onset  Has at least one of the high risk factor(s) for progression to severe COVID-19 and/or hospitalization as defined in EUA.  Specific high risk criteria : Other high risk medical condition per CDC:  SVI  Symptom onset 3 days ago. He has not taken otc medications and does not have prescriptions. Complains of cough primarily. Will send tessalon and albuterol. Also spoke to pharmacy who will get him delsym and mucinex.    I have spoken and communicated the following to the patient or parent/caregiver:  1. FDA has authorized the emergency use of casirivimab\imdevimab for the treatment of mild to moderate COVID-19 in adults and pediatric patients with positive results of direct SARS-CoV-2 viral testing who are 28 years of age and older weighing at least 40 kg, and who are at high risk for progressing to severe COVID-19 and/or hospitalization.  2. The significant known and potential risks and benefits of casirivimab\imdevimab, and the extent to which such potential risks and benefits are unknown.  3. Information on available alternative treatments and the risks and benefits of those alternatives, including clinical trials.  4. Patients treated with casirivimab\imdevimab should continue to self-isolate and use infection control measures (e.g., wear mask, isolate, social distance, avoid sharing personal  items, clean and disinfect "high touch" surfaces, and frequent handwashing) according to CDC guidelines.   5. The patient or parent/caregiver has the option to accept or refuse casirivimab\imdevimab .  After reviewing this information with the patient, The patient agreed to proceed with receiving casirivimab\imdevimab infusion and will be provided a copy of the Fact sheet prior to receiving the infusion.[name], DNP, AGNP-C [phone] (Infusion Center Hotline)

## 2019-12-13 ENCOUNTER — Ambulatory Visit (HOSPITAL_COMMUNITY)
Admission: RE | Admit: 2019-12-13 | Discharge: 2019-12-13 | Disposition: A | Discharge status: Home / Self Care | Payer: Medicaid Other | Source: Ambulatory Visit | Attending: Pulmonary Disease | Admitting: Pulmonary Disease

## 2019-12-13 DIAGNOSIS — U071 COVID-19: Secondary | ICD-10-CM | POA: Diagnosis present

## 2019-12-13 MED ORDER — SODIUM CHLORIDE 0.9 % IV SOLN
1200.0000 mg | Freq: Once | INTRAVENOUS | Status: AC
  Administered 2019-12-13: 1200 mg via INTRAVENOUS

## 2019-12-13 MED ORDER — METHYLPREDNISOLONE SODIUM SUCC 125 MG IJ SOLR: 125.0000 mg | Freq: Once | INTRAMUSCULAR | Status: DC | PRN

## 2019-12-13 MED ORDER — FAMOTIDINE IN NACL 20-0.9 MG/50ML-% IV SOLN: 20.0000 mg | Freq: Once | INTRAVENOUS | Status: DC | PRN

## 2019-12-13 MED ORDER — ONDANSETRON HCL 4 MG/2ML IJ SOLN
4.0000 mg | Freq: Once | INTRAMUSCULAR | Status: AC
  Administered 2019-12-13: 4 mg via INTRAVENOUS
  Filled 2019-12-13: qty 2

## 2019-12-13 MED ORDER — EPINEPHRINE 0.3 MG/0.3ML IJ SOAJ: 0.3000 mg | Freq: Once | INTRAMUSCULAR | Status: DC | PRN

## 2019-12-13 MED ORDER — DIPHENHYDRAMINE HCL 50 MG/ML IJ SOLN: 50.0000 mg | Freq: Once | INTRAMUSCULAR | Status: DC | PRN

## 2019-12-13 MED ORDER — ALBUTEROL SULFATE HFA 108 (90 BASE) MCG/ACT IN AERS: 2.0000 | INHALATION_SPRAY | Freq: Once | RESPIRATORY_TRACT | Status: DC | PRN

## 2019-12-13 MED ORDER — SODIUM CHLORIDE 0.9 % IV SOLN: INTRAVENOUS | Status: DC | PRN

## 2019-12-13 NOTE — Discharge Instructions (Signed)
10 Things You Can Do to Manage Your COVID-19 Symptoms at Home If you have possible or confirmed COVID-19: 1. Stay home from work and school. And stay away from other public places. If you must go out, avoid using any kind of public transportation, ridesharing, or taxis. 2. Monitor your symptoms carefully. If your symptoms get worse, call your healthcare provider immediately. 3. Get rest and stay hydrated. 4. If you have a medical appointment, call the healthcare provider ahead of time and tell them that you have or may have COVID-19. 5. For medical emergencies, call 911 and notify the dispatch personnel that you have or may have COVID-19. 6. Cover your cough and sneezes with a tissue or use the inside of your elbow. 7. Wash your hands often with soap and water for at least 20 seconds or clean your hands with an alcohol-based hand sanitizer that contains at least 60% alcohol. 8. As much as possible, stay in a specific room and away from other people in your home. Also, you should use a separate bathroom, if available. If you need to be around other people in or outside of the home, wear a mask. 9. Avoid sharing personal items with other people in your household, like dishes, towels, and bedding. Clean all surfaces that are touched often, like counters, tabletops, and doorknobs. Use household cleaning sprays or wipes according to the label instructions.Mr. What types of side effects do monoclonal antibody drugs cause?  Common side effects  In general, the more common side effects caused by monoclonal antibody drugs include: Allergic reactions, such as hives or itching Flu-like signs and symptoms, including chills, fatigue, fever, and muscle aches and pains Nausea, vomiting Diarrhea Skin rashes Low blood pressure   The CDC is recommending patients who receive monoclonal antibody treatments wait at least 90 days before being vaccinated.  10. Currently, there are no data on the safety and  efficacy of mRNA COVID-19 vaccines in persons who received monoclonal antibodies or convalescent plasma as part of COVID-19 treatment. Based on the estimated half-life of such therapies as well as evidence suggesting that reinfection is uncommon in the 90 days after initial infection, vaccination should be deferred for at least 90 days, as a precautionary measure until additional information becomes available, to avoid interference of the antibody treatment with vaccine-induced immune responses. [employer] 09/26/2018 This information is not intended to replace advice given to you by your health care provider. Make sure you discuss any questions you have with your health care provider. Document Revised: 02/28/2019 Document Reviewed: 02/28/2019 Elsevier Patient Education  2020 [employer].

## 2019-12-13 NOTE — Progress Notes (Signed)
  Diagnosis: COVID-19  Physician: Delford Field  Procedure: [facility] Infusion [facility] Med: casirivimab\imdevimab infusion - Provided patient with casirivimab\imdevimab fact sheet for patients, parents and caregivers prior to infusion.  Complications: No immediate complications noted.  Discharge: Discharged home   Shaune Spittle 12/13/2019

## 2019-12-19 ENCOUNTER — Other Ambulatory Visit: Payer: Self-pay

## 2019-12-19 ENCOUNTER — Emergency Department (HOSPITAL_BASED_OUTPATIENT_CLINIC_OR_DEPARTMENT_OTHER)
Admission: EM | Admit: 2019-12-19 | Discharge: 2019-12-19 | Disposition: A | Discharge status: Home / Self Care | Payer: Medicaid Other | Attending: Emergency Medicine | Admitting: Emergency Medicine

## 2019-12-19 ENCOUNTER — Encounter (HOSPITAL_BASED_OUTPATIENT_CLINIC_OR_DEPARTMENT_OTHER): Payer: Self-pay | Admitting: *Deleted

## 2019-12-19 DIAGNOSIS — R079 Chest pain, unspecified: Secondary | ICD-10-CM | POA: Diagnosis present

## 2019-12-19 DIAGNOSIS — F17210 Nicotine dependence, cigarettes, uncomplicated: Secondary | ICD-10-CM | POA: Insufficient documentation

## 2019-12-19 DIAGNOSIS — Z79899 Other long term (current) drug therapy: Secondary | ICD-10-CM | POA: Insufficient documentation

## 2019-12-19 DIAGNOSIS — U071 COVID-19: Secondary | ICD-10-CM

## 2019-12-19 LAB — RESPIRATORY PANEL BY RT PCR (FLU A&B, COVID)
Influenza A by PCR: NEGATIVE
Influenza B by PCR: NEGATIVE
SARS Coronavirus 2 by RT PCR: POSITIVE — AB

## 2019-12-19 NOTE — ED Provider Notes (Signed)
[facility] EMERGENCY DEPARTMENT Provider Note   CSN: [id number] Arrival date & time: 12/19/19  1357     History Chief Complaint  Patient presents with  . needs covid test/ work note    + covid    Mark Mason is a 34 y.o. male who presents for evaluation of needing Covid test.  He reports that he was tested positive for Covid approximately 10 days ago.  He states that he has generally been feeling well denies any complaints.  He states that he needed to go back to work and needed a Covid test in order to go back to work.  He states he has not been having any chest pain, difficulty breathing.  The history is provided by the patient.       History reviewed. No pertinent past medical history.  There are no problems to display for this patient.   History reviewed. No pertinent surgical history.     No family history on file.  Social History   Tobacco Use  . Smoking status: Current Every Day Smoker    Packs/day: 0.50    Types: Cigarettes  . Smokeless tobacco: Never Used  Substance Use Topics  . Alcohol use: Yes  . Drug use: No    Home Medications Prior to Admission medications   Medication Sig Start Date End Date Taking? Authorizing Provider  albuterol (VENTOLIN HFA) 108 (90 Base) MCG/ACT inhaler Inhale 2 puffs into the lungs every 6 (six) hours as needed for wheezing or shortness of breath. 12/12/19   Alinda Dooms, NP  benzonatate (TESSALON) 200 MG capsule Take 1 capsule (200 mg total) by mouth 3 (three) times daily as needed for cough. 12/12/19   Alinda Dooms, NP  cyclobenzaprine (FLEXERIL) 5 MG tablet Take 2 tablets (10 mg total) by mouth 3 (three) times daily as needed for muscle spasms. 12/14/15   Deborha Payment, PA-C  doxycycline (VIBRAMYCIN) 100 MG capsule Take 1 capsule (100 mg total) by mouth 2 (two) times daily. 06/12/13   Elson Areas, PA-C  hydrocortisone (ANUSOL-HC) 2.5 % rectal cream Apply rectally 2 times daily 06/12/13   Cheron Schaumann K,  PA-C  naproxen (NAPROSYN) 500 MG tablet Take 1 tablet (500 mg total) by mouth 2 (two) times daily. 12/14/15   Deborha Payment, PA-C    Allergies    Fish oil  Review of Systems   Review of Systems  Respiratory: Negative for shortness of breath.   Cardiovascular: Negative for chest pain.  All other systems reviewed and are negative.   Physical Exam Updated Vital Signs BP (!) 127/93   Pulse 88   Temp 98.2 F (36.8 C) (Oral)   Resp 18   SpO2 97%   Physical Exam Vitals and nursing note reviewed.  Constitutional:      Appearance: He is well-developed.  HENT:     Head: Normocephalic and atraumatic.  Eyes:     General: No scleral icterus.       Right eye: No discharge.        Left eye: No discharge.     Conjunctiva/sclera: Conjunctivae normal.  Pulmonary:     Effort: Pulmonary effort is normal.     Comments: Lungs clear to auscultation bilaterally.  Symmetric chest rise.  No wheezing, rales, rhonchi. Skin:    General: Skin is warm and dry.  Neurological:     Mental Status: He is alert.  Psychiatric:        Speech: Speech normal.  Behavior: Behavior normal.     ED Results / Procedures / Treatments   Labs (all labs ordered are listed, but only abnormal results are displayed) Labs Reviewed  RESPIRATORY PANEL BY RT PCR (FLU A&B, COVID) - Abnormal; Notable for the following components:      Result Value   SARS Coronavirus 2 by RT PCR POSITIVE (*)    All other components within normal limits    EKG None  Radiology No results found.  Procedures Procedures (including critical care time)  Medications Ordered in ED Medications - No data to display  ED Course  I have reviewed the triage vital signs and the nursing notes.  Pertinent labs & imaging results that were available during my care of the patient were reviewed by me and considered in my medical decision making (see chart for details).    MDM Rules/Calculators/A&P                           34 year old male presenting to ED for evaluation of needing Covid test.  Reports Covid +10 days ago.  No symptoms at this time but needs a negative test to go back to work.  We will plan for Covid test.  Initially arrival, he is afebrile, nontoxic-appearing.  Vital signs are stable.  Covid test is positive.  Discussed results with patient.  Discussed with him that he should continue quarantine. At this time, patient exhibits no emergent life-threatening condition that require further evaluation in ED. Patient had ample opportunity for questions and discussion. All patient's questions were answered with full understanding. Strict return precautions discussed. Patient expresses understanding and agreement to plan.   Mark Mason was evaluated in Emergency Department on 12/19/2019 for the symptoms described in the history of present illness. He was evaluated in the context of the global COVID-19 pandemic, which necessitated consideration that the patient might be at risk for infection with the SARS-CoV-2 virus that causes COVID-19. Institutional protocols and algorithms that pertain to the evaluation of patients at risk for COVID-19 are in a state of rapid change based on information released by regulatory bodies including the CDC and federal and state organizations. These policies and algorithms were followed during the patient's care in the ED.  Portions of this note were generated with Scientist, clinical (histocompatibility and immunogenetics). Dictation errors may occur despite best attempts at proofreading.  Final Clinical Impression(s) / ED Diagnoses Final diagnoses:  Lab test positive for detection of COVID-19 virus    Rx / DC Orders ED Discharge Orders    None       Rosana Hoes 12/19/19 1632    Arby Barrette, MD 12/29/19 1219

## 2019-12-19 NOTE — Discharge Instructions (Signed)
As we discussed, your Covid test today was positive.  You should continue to quarantine.  He can try to significant after day 14 see if you are negative.

## 2019-12-19 NOTE — ED Triage Notes (Signed)
Requesting covid test  And return to work note , 10 days post covid

## 2022-09-01 ENCOUNTER — Other Ambulatory Visit: Payer: Self-pay

## 2022-09-01 ENCOUNTER — Encounter (HOSPITAL_BASED_OUTPATIENT_CLINIC_OR_DEPARTMENT_OTHER): Payer: Self-pay | Admitting: Emergency Medicine

## 2022-09-01 ENCOUNTER — Emergency Department (HOSPITAL_BASED_OUTPATIENT_CLINIC_OR_DEPARTMENT_OTHER)
Admission: EM | Admit: 2022-09-01 | Discharge: 2022-09-01 | Disposition: A | Discharge status: Home / Self Care | Payer: Medicaid Other | Attending: Emergency Medicine | Admitting: Emergency Medicine

## 2022-09-01 DIAGNOSIS — R112 Nausea with vomiting, unspecified: Secondary | ICD-10-CM | POA: Insufficient documentation

## 2022-09-01 DIAGNOSIS — R1084 Generalized abdominal pain: Secondary | ICD-10-CM | POA: Diagnosis not present

## 2022-09-01 DIAGNOSIS — R197 Diarrhea, unspecified: Secondary | ICD-10-CM | POA: Insufficient documentation

## 2022-09-01 LAB — CBC WITH DIFFERENTIAL/PLATELET
Abs Immature Granulocytes: 0.03 10*3/uL (ref 0.00–0.07)
Basophils Absolute: 0.1 10*3/uL (ref 0.0–0.1)
Basophils Relative: 0 %
Eosinophils Absolute: 0 10*3/uL (ref 0.0–0.5)
Eosinophils Relative: 0 %
HCT: 44.9 % (ref 39.0–52.0)
Hemoglobin: 15.7 g/dL (ref 13.0–17.0)
Immature Granulocytes: 0 %
Lymphocytes Relative: 13 %
Lymphs Abs: 1.8 10*3/uL (ref 0.7–4.0)
MCH: 29.2 pg (ref 26.0–34.0)
MCHC: 35 g/dL (ref 30.0–36.0)
MCV: 83.6 fL (ref 80.0–100.0)
Monocytes Absolute: 0.8 10*3/uL (ref 0.1–1.0)
Monocytes Relative: 6 %
Neutro Abs: 11.6 10*3/uL — ABNORMAL HIGH (ref 1.7–7.7)
Neutrophils Relative %: 81 %
Platelets: 256 10*3/uL (ref 150–400)
RBC: 5.37 MIL/uL (ref 4.22–5.81)
RDW: 13 % (ref 11.5–15.5)
WBC: 14.3 10*3/uL — ABNORMAL HIGH (ref 4.0–10.5)
nRBC: 0 % (ref 0.0–0.2)

## 2022-09-01 LAB — COMPREHENSIVE METABOLIC PANEL
ALT: 42 U/L (ref 0–44)
AST: 24 U/L (ref 15–41)
Albumin: 5.1 g/dL — ABNORMAL HIGH (ref 3.5–5.0)
Alkaline Phosphatase: 64 U/L (ref 38–126)
Anion gap: 11 (ref 5–15)
BUN: 19 mg/dL (ref 6–20)
CO2: 23 mmol/L (ref 22–32)
Calcium: 10.1 mg/dL (ref 8.9–10.3)
Chloride: 105 mmol/L (ref 98–111)
Creatinine, Ser: 0.85 mg/dL (ref 0.61–1.24)
GFR, Estimated: >60 mL/min (ref >60)
Glucose, Bld: 172 mg/dL — ABNORMAL HIGH (ref 70–99)
Potassium: 3.5 mmol/L (ref 3.5–5.1)
Sodium: 139 mmol/L (ref 135–145)
Total Bilirubin: 1.2 mg/dL (ref 0.3–1.2)
Total Protein: 8.3 g/dL — ABNORMAL HIGH (ref 6.5–8.1)

## 2022-09-01 LAB — LIPASE, BLOOD: Lipase: 34 U/L (ref 11–51)

## 2022-09-01 MED ORDER — DICYCLOMINE HCL 10 MG/ML IM SOLN
20.0000 mg | Freq: Once | INTRAMUSCULAR | Status: AC
  Administered 2022-09-01: 20 mg via INTRAMUSCULAR
  Filled 2022-09-01: qty 2

## 2022-09-01 MED ORDER — ONDANSETRON HCL 4 MG/2ML IJ SOLN
4.0000 mg | Freq: Once | INTRAMUSCULAR | Status: AC
  Administered 2022-09-01: 4 mg via INTRAVENOUS
  Filled 2022-09-01: qty 2

## 2022-09-01 MED ORDER — ONDANSETRON 4 MG PO TBDP: 4.0000 mg | ORAL_TABLET | Freq: Three times a day (TID) | ORAL | 0 refills | Status: AC | PRN

## 2022-09-01 MED ORDER — DICYCLOMINE HCL 20 MG PO TABS: 20.0000 mg | ORAL_TABLET | Freq: Two times a day (BID) | ORAL | 0 refills | Status: AC

## 2022-09-01 MED ORDER — SODIUM CHLORIDE 0.9 % IV BOLUS
1000.0000 mL | Freq: Once | INTRAVENOUS | Status: AC
  Administered 2022-09-01: 1000 mL via INTRAVENOUS

## 2022-09-01 NOTE — ED Provider Notes (Signed)
[place] [facility] Provider Note   CSN: [id number] Arrival date & time: 09/01/22  1045     History  Chief Complaint  Patient presents with   Abdominal Pain   Emesis    Mark Mason is a 37 y.o. male.  Patient with no pertinent past medical history presents today with complaints of nausea, vomiting, and diarrhea.  He states that same began yesterday and has been persistent since then.  He states that he had chicken from a taco truck that he did not think tasted right.  He called the truck this morning and was told that several other customers have called them with similar complaints.  He denies fevers or chills.  No history of abdominal surgeries.  Endorses some abdominal cramping but it is mild. Denies hematemesis, hematochezia, or melena.  No urinary symptoms.  He states that he has to go to work in 30 minutes presents for fluids and nausea medication to get through the day. He has already made an appointment with his pcp tomorrow for follow-up.  The history is provided by the patient. No language interpreter was used.  Abdominal Pain Associated symptoms: diarrhea, nausea and vomiting   Emesis Associated symptoms: abdominal pain and diarrhea        Home Medications Prior to Admission medications   Medication Sig Start Date End Date Taking? Authorizing Provider  albuterol (VENTOLIN HFA) 108 (90 Base) MCG/ACT inhaler Inhale 2 puffs into the lungs every 6 (six) hours as needed for wheezing or shortness of breath. 12/12/19   Alinda Dooms, NP  benzonatate (TESSALON) 200 MG capsule Take 1 capsule (200 mg total) by mouth 3 (three) times daily as needed for cough. 12/12/19   Alinda Dooms, NP  cyclobenzaprine (FLEXERIL) 5 MG tablet Take 2 tablets (10 mg total) by mouth 3 (three) times daily as needed for muscle spasms. 12/14/15   Deborha Payment, PA-C  doxycycline (VIBRAMYCIN) 100 MG capsule Take 1 capsule (100 mg total) by mouth 2 (two) times  daily. 06/12/13   Elson Areas, PA-C  hydrocortisone (ANUSOL-HC) 2.5 % rectal cream Apply rectally 2 times daily 06/12/13   Cheron Schaumann K, PA-C  naproxen (NAPROSYN) 500 MG tablet Take 1 tablet (500 mg total) by mouth 2 (two) times daily. 12/14/15   Deborha Payment, PA-C      Allergies    Bee venom and Fish oil    Review of Systems   Review of Systems  Gastrointestinal:  Positive for abdominal pain, diarrhea, nausea and vomiting.  All other systems reviewed and are negative.   Physical Exam Updated Vital Signs BP (!) 149/110 (BP Location: Left Arm)   Pulse (!) 118   Temp 98.5 F (36.9 C) (Oral)   Resp 17   Ht 5\' 5"  (1.651 m)   Wt 99.8 kg   SpO2 99%   BMI 36.61 kg/m  Physical Exam Vitals and nursing note reviewed.  Constitutional:      General: He is not in acute distress.    Appearance: Normal appearance. He is normal weight. He is not ill-appearing, toxic-appearing or diaphoretic.  HENT:     Head: Normocephalic and atraumatic.  Cardiovascular:     Rate and Rhythm: Normal rate.  Pulmonary:     Effort: Pulmonary effort is normal. No respiratory distress.  Abdominal:     General: Abdomen is flat.     Palpations: Abdomen is soft.     Tenderness: There is generalized abdominal tenderness.  Musculoskeletal:        General: Normal range of motion.     Cervical back: Normal range of motion.  Skin:    General: Skin is warm and dry.  Neurological:     General: No focal deficit present.     Mental Status: He is alert.  Psychiatric:        Mood and Affect: Mood normal.        Behavior: Behavior normal.     ED Results / Procedures / Treatments   Labs (all labs ordered are listed, but only abnormal results are displayed) Labs Reviewed  COMPREHENSIVE METABOLIC PANEL - Abnormal; Notable for the following components:      Result Value   Glucose, Bld 172 (*)    Total Protein 8.3 (*)    Albumin 5.1 (*)    All other components within normal limits  CBC WITH  DIFFERENTIAL/PLATELET - Abnormal; Notable for the following components:   WBC 14.3 (*)    Neutro Abs 11.6 (*)    All other components within normal limits  LIPASE, BLOOD  URINALYSIS, ROUTINE W REFLEX MICROSCOPIC    EKG None  Radiology No results found.  Procedures Procedures    Medications Ordered in ED Medications  sodium chloride 0.9 % bolus 1,000 mL (1,000 mLs Intravenous New Bag/Given 09/01/22 1120)  ondansetron (ZOFRAN) injection 4 mg (4 mg Intravenous Given 09/01/22 1118)    ED Course/ Medical Decision Making/ A&P                             Medical Decision Making Amount and/or Complexity of Data Reviewed Labs: ordered.  Risk Prescription drug management.   This patient is a 37 y.o. male who presents to the ED for concern of nausea, vomiting, diarrhea, this involves an extensive number of treatment options, and is a complaint that carries with it a high risk of complications and morbidity. The emergent differential diagnosis prior to evaluation includes, but is not limited to,  The differential diagnosis for generalized abdominal pain includes, but is not limited to gastroenteritis, appendicitis, Gastroparesis, DKA, Hernia, Inflammatory bowel disease, pancreatitis This is not an exhaustive differential.   Past Medical History / Co-morbidities / Social History: Patient with pcp appointment for tomorrow  Physical Exam: Physical exam performed. The pertinent findings include: Well-appearing, generalized abdominal TTP  Lab Tests: I ordered, and personally interpreted labs.  The pertinent results include:  WBC 14.3, glucose 172   Medications: I ordered medication including zofran, bentyl, fluids  for nausea, pain, dehydration. Reevaluation of the patient after these medicines showed that the patient improved. I have reviewed the patients home medicines and have made adjustments as needed.  Disposition: After consideration of the diagnostic results and the patients  response to treatment, I feel that emergency department workup does not suggest an emergent condition requiring admission or immediate intervention beyond what has been performed at this time. The plan is: Discharge with close PCP follow-up at his appointment tomorrow. Patient is nontoxic, nonseptic appearing, in no apparent distress.  Patient's pain and other symptoms adequately managed in emergency department.  Fluid bolus given.  Labs and vitals reviewed.  Patient does not meet the SIRS or Sepsis criteria.  On repeat exam patient does not have a surgical abdomin and there are no peritoneal signs.  No indication of appendicitis, bowel obstruction, bowel perforation, cholecystitis, diverticulitis.  Suspect patient has gastroenteritis, discussed with patient who is understanding and in agreement.  I did offer further evaluation with CT imaging which she declined as he states he is feeling much better after fluids and medication today.  Notably, patient's lipase and UA have not resulted yet, however patient states that he needs to get back to work and will follow his results and return if anything is abnormal.  He also is going to his primary care doctor tomorrow.  Patient discharged home with Zofran and Bentyl for symptomatic treatment and given strict instructions for follow-up with their primary care physician.  I have also discussed reasons to return immediately to the ER.  Patient expresses understanding and agrees with plan.  Patient discharged in stable condition.   Final Clinical Impression(s) / ED Diagnoses Final diagnoses:  Nausea vomiting and diarrhea    Rx / DC Orders ED Discharge Orders          Ordered    ondansetron (ZOFRAN-ODT) 4 MG disintegrating tablet  Every 8 hours PRN        09/01/22 1201    dicyclomine (BENTYL) 20 MG tablet  2 times daily        09/01/22 1201          An After Visit Summary was printed and given to the patient.     Vear Clock 09/01/22 1201     Gwyneth Sprout, MD 09/05/22 2329

## 2022-09-01 NOTE — Discharge Instructions (Addendum)
As we discussed, your workup in the ER today thus far has been reassuring for acute findings.  Not all of your labs have resulted yet, however you have chosen to be discharged prior to all of your results coming back.  I recommend that you closely monitor these remaining results and return if anything is abnormal.  Follow-up with your primary care doctor at your appointment tomorrow as well.  Additionally, I have given you a prescription for Zofran and Bentyl for you to take as prescribed as needed for any residual symptoms.  Please ensure you are maintaining adequate hydration with fluids that have electrolytes such as sports drinks.  Return if development of any new or worsening symptoms.

## 2022-09-01 NOTE — ED Notes (Signed)

## 2022-09-01 NOTE — ED Notes (Addendum)
Pt secured in bed. Ate at a food truck the day prior, knew the chicken didn't taste right. Came home and was sick until this morning. Called the food truck and multiple other customers had called to advise the food was causing their GI upset as well. No active emesis. Pt diaphoretic. Emesis bag given.   Has scheduled appt with PCP tomorrow morning. PCP requested overnight antiemetics to cover until she can prescribe further tx tomorrow for patient.

## 2022-09-01 NOTE — ED Triage Notes (Signed)
Pt c/o abdominal cramping, n/v/d since early this morning. States he is concerned for food poisoning after eating from a taco truck yesterday.
